# Patient Record
Sex: Female | Born: 1975 | Race: White | Hispanic: No | Marital: Single | Smoking: Never smoker
Health system: Southern US, Community
[De-identification: ages and names within clinical notes are randomized; demographics above are authoritative.]

## PROBLEM LIST (undated history)

## (undated) DIAGNOSIS — E119 Type 2 diabetes mellitus without complications: Secondary | ICD-10-CM

## (undated) DIAGNOSIS — K589 Irritable bowel syndrome without diarrhea: Secondary | ICD-10-CM

## (undated) DIAGNOSIS — L309 Dermatitis, unspecified: Secondary | ICD-10-CM

## (undated) HISTORY — DX: Irritable bowel syndrome, unspecified: K58.9

## (undated) HISTORY — PX: COLONOSCOPY: SHX174

## (undated) HISTORY — DX: Type 2 diabetes mellitus without complications: E11.9

## (undated) HISTORY — PX: GALLBLADDER SURGERY: SHX652

## (undated) HISTORY — DX: Dermatitis, unspecified: L30.9

## (undated) HISTORY — PX: TONSILECTOMY/ADENOIDECTOMY WITH MYRINGOTOMY: SHX6125

---

## 2016-10-01 DIAGNOSIS — K219 Gastro-esophageal reflux disease without esophagitis: Secondary | ICD-10-CM | POA: Insufficient documentation

## 2018-01-08 ENCOUNTER — Other Ambulatory Visit: Payer: Self-pay | Admitting: Surgery

## 2018-01-08 DIAGNOSIS — R110 Nausea: Secondary | ICD-10-CM

## 2018-01-08 DIAGNOSIS — K2100 Gastro-esophageal reflux disease with esophagitis, without bleeding: Secondary | ICD-10-CM

## 2018-01-08 DIAGNOSIS — K449 Diaphragmatic hernia without obstruction or gangrene: Secondary | ICD-10-CM

## 2018-01-13 ENCOUNTER — Ambulatory Visit
Admission: RE | Admit: 2018-01-13 | Discharge: 2018-01-13 | Disposition: A | Discharge status: Home / Self Care | Payer: BLUE CROSS/BLUE SHIELD | Source: Ambulatory Visit | Attending: Surgery | Admitting: Surgery

## 2018-01-13 DIAGNOSIS — K2100 Gastro-esophageal reflux disease with esophagitis, without bleeding: Secondary | ICD-10-CM

## 2018-01-13 DIAGNOSIS — K449 Diaphragmatic hernia without obstruction or gangrene: Secondary | ICD-10-CM

## 2018-01-13 DIAGNOSIS — R110 Nausea: Secondary | ICD-10-CM

## 2018-01-13 MED ORDER — IOPAMIDOL (ISOVUE-300) INJECTION 61%
100.0000 mL | Freq: Once | INTRAVENOUS | Status: AC | PRN
  Administered 2018-01-13: 100 mL via INTRAVENOUS

## 2018-11-28 ENCOUNTER — Ambulatory Visit: Payer: Self-pay | Admitting: *Deleted

## 2018-11-28 NOTE — Telephone Encounter (Signed)
Pt called with having a cough. No fever, shortness of breath. She thinks it is just a cold. Has not traveled. Home care advice given to her with verbal understanding. Pt advised to call back for increase in symptoms of shortness of breath, fever, cough, chest pain or not feeling well.  Reason for Disposition . Cough  Answer Assessment - Initial Assessment Questions 1. ONSET: "When did the cough begin?"      Last couple of days 2. SEVERITY: "How bad is the cough today?"      Not really bad 3. 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 FEVER: "Do you have a fever?" If so, ask: "What is your temperature, how was it measured, and when did it start?"     no 5. SPUTUM: "Describe the color of your sputum" (clear, white, yellow, green)     clear 6. HEMOPTYSIS: "Are you coughing up any blood?" If so ask: "How much?" (flecks, streaks, tablespoons, etc.)     no 7. CARDIAC HISTORY: "Do you have any history of heart disease?" (e.g., heart attack, congestive heart failure)      no 8. LUNG HISTORY: "Do you have any history of lung disease?"  (e.g., pulmonary embolus, asthma, emphysema)     no 9. PE RISK FACTORS: "Do you have a history of blood clots?" (or: recent major surgery, recent prolonged travel, bedridden)     no 10. OTHER SYMPTOMS: "Do you have any other symptoms?" (e.g., runny nose, wheezing, chest pain)       Chest pain 11. PREGNANCY: "Is there any chance you are pregnant?" "When was your last menstrual period?"       Not pregnant LMP on now 65. TRAVEL: "Have you traveled out of the country in the last month?" (e.g., travel history, exposures)       no  Protocols used: COUGH - ACUTE PRODUCTIVE-A-AH

## 2019-12-14 DIAGNOSIS — M1712 Unilateral primary osteoarthritis, left knee: Secondary | ICD-10-CM | POA: Insufficient documentation

## 2021-03-06 ENCOUNTER — Ambulatory Visit (INDEPENDENT_AMBULATORY_CARE_PROVIDER_SITE_OTHER): Payer: BC Managed Care – PPO | Admitting: Obstetrics & Gynecology

## 2021-03-06 ENCOUNTER — Encounter: Payer: Self-pay | Admitting: Obstetrics & Gynecology

## 2021-03-06 ENCOUNTER — Other Ambulatory Visit: Payer: Self-pay

## 2021-03-06 ENCOUNTER — Other Ambulatory Visit (HOSPITAL_COMMUNITY)
Admission: RE | Admit: 2021-03-06 | Discharge: 2021-03-06 | Disposition: A | Discharge status: Home / Self Care | Payer: BC Managed Care – PPO | Source: Ambulatory Visit | Attending: Obstetrics & Gynecology | Admitting: Obstetrics & Gynecology

## 2021-03-06 VITALS — BP 134/93 | HR 103 | Ht 65.0 in | Wt 182.0 lb

## 2021-03-06 DIAGNOSIS — N951 Menopausal and female climacteric states: Secondary | ICD-10-CM | POA: Diagnosis not present

## 2021-03-06 DIAGNOSIS — N898 Other specified noninflammatory disorders of vagina: Secondary | ICD-10-CM | POA: Insufficient documentation

## 2021-03-06 DIAGNOSIS — R102 Pelvic and perineal pain: Secondary | ICD-10-CM | POA: Diagnosis not present

## 2021-03-06 DIAGNOSIS — Z139 Encounter for screening, unspecified: Secondary | ICD-10-CM | POA: Diagnosis not present

## 2021-03-06 DIAGNOSIS — N912 Amenorrhea, unspecified: Secondary | ICD-10-CM

## 2021-03-06 NOTE — Progress Notes (Signed)
   Subjective:    Patient ID: Yvette Knox, female    DOB: 02/20/1976, 45 y.o.   MRN: [id number]  HPI 45 year old female presents for evaluation of pelvic pain.  Patient was sent by her gastroenterologist to make sure that there was no GYN pathology causing pain.  The working diagnosis is bad irritable bowel syndrome.  Patient believes that she is in menopause.  She was on birth control pills for a long time and did not have her period.  She stopped several years ago and never administrated again.  Patient denies polycystic ovarian syndrome.  Patient has diarrhea and constipation.  Patient does not have pain with intercourse.  Patient has no urinary complaints.   Review of Systems  Constitutional: Negative.   Respiratory: Negative.    Cardiovascular: Negative.   Gastrointestinal:  Positive for abdominal pain, constipation and diarrhea.  Genitourinary:  Positive for pelvic pain. Negative for dyspareunia, frequency, vaginal bleeding and vaginal discharge.  Psychiatric/Behavioral: Negative.        Objective:   Physical Exam Vitals reviewed.  Constitutional:      General: She is not in acute distress.    Appearance: She is well-developed.  HENT:     Head: Normocephalic and atraumatic.  Eyes:     Conjunctiva/sclera: Conjunctivae normal.  Cardiovascular:     Rate and Rhythm: Normal rate.  Pulmonary:     Effort: Pulmonary effort is normal.  Abdominal:     General: Abdomen is flat. There is no distension.     Tenderness: There is no abdominal tenderness.  Genitourinary:    Comments: Tanner V Vulva:  No lesion Vagina:  Atrophic, no discharge Cervix:  No CMT Uterus:  Non tender, mobile Right adnexa--non tender, no mass Left adnexa--non tender, no mass   Skin:    General: Skin is warm and dry.  Neurological:     Mental Status: She is alert and oriented to person, place, and time.  Psychiatric:        Mood and Affect: Mood normal.  Vitals:   03/06/21 1334  BP: (!) 134/93   Pulse: (!) 103  Weight: 182 lb (82.6 kg)  Height: 5\' 5"  (1.651 m)       Assessment & Plan:  45 year old female presents with pelvic pain and early menopause. Will order complete pelvic ultrasound to look at her uterus and ovaries to make sure that everything is okay and not contributing to her pelvic pain. Patient needs an annual exam with Pap smear and yearly mammogram SH today to see if she is truly menopausal or has amenorrhea for another reason.  30 minutes was spent with patient during the exam, review of records, counseling, and documentation.

## 2021-03-06 NOTE — Progress Notes (Signed)
Vaginal discharge and pelvic pain

## 2021-03-07 LAB — FOLLICLE STIMULATING HORMONE: FSH: 75.1 m[IU]/mL

## 2021-03-07 LAB — CERVICOVAGINAL ANCILLARY ONLY
Bacterial Vaginitis (gardnerella): NEGATIVE
Candida Glabrata: NEGATIVE
Candida Vaginitis: NEGATIVE
Comment: NEGATIVE
Comment: NEGATIVE
Comment: NEGATIVE

## 2021-03-08 ENCOUNTER — Telehealth: Payer: Self-pay | Admitting: *Deleted

## 2021-03-08 NOTE — Telephone Encounter (Signed)
LM on voicemail regarding her recent test results and Dr Leggett's recommendations.  Encouraged patient to sign up for My chart so that she can view her test results and she can communicate messages through the my chart.

## 2021-03-08 NOTE — Telephone Encounter (Signed)
-----   Message from Yvette Dukes, MD sent at 03/08/2021 12:19 PM EDT ----- Please call patient and have her sign up for MyChart.  Also tell her that her FSH level shows that she is in menopause.  It is important that she let us know if she ever has vaginal bleeding again.  She should also take 1200 mg of calcium a day as well as 400 units of vitamin D.

## 2021-03-22 ENCOUNTER — Other Ambulatory Visit: Payer: BC Managed Care – PPO

## 2021-03-22 ENCOUNTER — Ambulatory Visit: Payer: BC Managed Care – PPO

## 2021-03-23 ENCOUNTER — Ambulatory Visit (INDEPENDENT_AMBULATORY_CARE_PROVIDER_SITE_OTHER): Payer: BC Managed Care – PPO

## 2021-03-23 ENCOUNTER — Other Ambulatory Visit: Payer: Self-pay

## 2021-03-23 DIAGNOSIS — R102 Pelvic and perineal pain: Secondary | ICD-10-CM

## 2021-03-23 DIAGNOSIS — Z1231 Encounter for screening mammogram for malignant neoplasm of breast: Secondary | ICD-10-CM

## 2021-03-27 ENCOUNTER — Other Ambulatory Visit: Payer: BC Managed Care – PPO

## 2021-04-10 ENCOUNTER — Encounter: Payer: BC Managed Care – PPO | Admitting: Obstetrics & Gynecology

## 2021-04-10 NOTE — Progress Notes (Signed)
This encounter was created in error - please disregard.

## 2021-04-17 ENCOUNTER — Other Ambulatory Visit: Payer: BC Managed Care – PPO | Admitting: Obstetrics & Gynecology

## 2021-05-01 ENCOUNTER — Other Ambulatory Visit: Payer: Self-pay

## 2021-05-01 ENCOUNTER — Other Ambulatory Visit (HOSPITAL_COMMUNITY)
Admission: RE | Admit: 2021-05-01 | Discharge: 2021-05-01 | Disposition: A | Discharge status: Home / Self Care | Payer: BC Managed Care – PPO | Source: Ambulatory Visit | Attending: Obstetrics & Gynecology | Admitting: Obstetrics & Gynecology

## 2021-05-01 ENCOUNTER — Encounter: Payer: Self-pay | Admitting: Obstetrics & Gynecology

## 2021-05-01 ENCOUNTER — Ambulatory Visit (INDEPENDENT_AMBULATORY_CARE_PROVIDER_SITE_OTHER): Payer: BC Managed Care – PPO | Admitting: Obstetrics & Gynecology

## 2021-05-01 VITALS — BP 137/91 | HR 102 | Ht 65.0 in | Wt 179.0 lb

## 2021-05-01 DIAGNOSIS — E119 Type 2 diabetes mellitus without complications: Secondary | ICD-10-CM

## 2021-05-01 DIAGNOSIS — Z01812 Encounter for preprocedural laboratory examination: Secondary | ICD-10-CM

## 2021-05-01 DIAGNOSIS — R9389 Abnormal findings on diagnostic imaging of other specified body structures: Secondary | ICD-10-CM

## 2021-05-01 LAB — POCT URINE PREGNANCY: Preg Test, Ur: NEGATIVE

## 2021-05-01 NOTE — Progress Notes (Signed)
   Subjective:    Patient ID: Yvette Knox, female    DOB: [birth year], 45 y.o.   MRN: [id number]  HPI  45 year old female presents for Demetra biopsy.  She is postmenopausal is found to have a thickened endometrium on ultrasound.  She has not had any bleeding.  Pain is better.  Patient recently was diagnosed with COVID and also bronchitis.  She was placed on steroids.  Her blood sugar went very high up into the 600s.  She was diagnosed with type 2 diabetes.  Her hemoglobin A1c was over 9.  Patient has been on metformin and her sugars have been much better.  Review of Systems  Constitutional:  Positive for fatigue.  Respiratory:  Positive for cough.   Gastrointestinal: Negative.   Genitourinary:  Positive for pelvic pain and vaginal bleeding.  Psychiatric/Behavioral: Negative.        Objective:   Physical Exam Vitals reviewed.  Constitutional:      General: She is not in acute distress.    Appearance: She is well-developed.  HENT:     Head: Normocephalic and atraumatic.  Eyes:     Conjunctiva/sclera: Conjunctivae normal.  Cardiovascular:     Rate and Rhythm: Normal rate.  Pulmonary:     Effort: Pulmonary effort is normal.  Abdominal:     General: Abdomen is flat.     Palpations: Abdomen is soft.  Genitourinary:    Comments: Tanner V Vulva:  No lesion Vagina:  Pink, no lesions, no discharge, no blood, atrophic Cervix:  No CMT Skin:    General: Skin is warm and dry.  Neurological:     Mental Status: She is alert and oriented to person, place, and time.  Psychiatric:        Mood and Affect: Mood normal.   ENDOMETRIAL BIOPSY     The indications for endometrial biopsy were reviewed.   Risks of the biopsy including cramping, bleeding, infection, uterine perforation, inadequate specimen and need for additional procedures  were discussed. The patient states she understands and agrees to undergo procedure today. Consent was signed. Time out was performed. Urine HCG was  negative. A sterile speculum was placed in the patient's vagina and the cervix was prepped with Betadine. A single-toothed tenaculum was placed on the anterior lip of the cervix to stabilize it. The 3 mm pipelle was introduced into the endometrial cavity without difficulty to a depth of 8 cm, and a moderate amount of tissue was obtained and sent to pathology. The instruments were removed from the patient's vagina. Minimal bleeding from the cervix was noted. The patient tolerated the procedure well. Routine post-procedure instructions were given to the patient. The patient will follow up to review the results and for further management.    Assessment & Plan:  45 year old female presents for endometrial biopsy.  She also had some question about her diabetes.  Her sugar approximately 3 hours postprandial is 195.  Patient was given some suggestions on decreasing her beverages with sugar.  She will follow-up with her primary care provider for her diabetes.  Patient should continue her metformin.  Endometrial biopsy performed without incident.  Patient still needs to come in for annual exam.

## 2021-05-03 LAB — SURGICAL PATHOLOGY

## 2021-05-08 ENCOUNTER — Telehealth (INDEPENDENT_AMBULATORY_CARE_PROVIDER_SITE_OTHER): Payer: BC Managed Care – PPO | Admitting: Obstetrics & Gynecology

## 2021-05-08 ENCOUNTER — Encounter: Payer: Self-pay | Admitting: Obstetrics & Gynecology

## 2021-05-08 ENCOUNTER — Other Ambulatory Visit: Payer: Self-pay

## 2021-05-08 DIAGNOSIS — N840 Polyp of corpus uteri: Secondary | ICD-10-CM

## 2021-05-08 DIAGNOSIS — E1169 Type 2 diabetes mellitus with other specified complication: Secondary | ICD-10-CM

## 2021-05-08 DIAGNOSIS — Z712 Person consulting for explanation of examination or test findings: Secondary | ICD-10-CM

## 2021-05-08 DIAGNOSIS — R9389 Abnormal findings on diagnostic imaging of other specified body structures: Secondary | ICD-10-CM

## 2021-05-08 DIAGNOSIS — E119 Type 2 diabetes mellitus without complications: Secondary | ICD-10-CM | POA: Insufficient documentation

## 2021-05-08 NOTE — Progress Notes (Signed)
[place] Sonohysterogram ordered per Dr.Leggett

## 2021-05-08 NOTE — Progress Notes (Signed)
GYNECOLOGY VIRTUAL VISIT ENCOUNTER NOTE  Provider location: [facility] [place]   Patient location: Car  I connected with Yvette Knox on 05/08/21 at  2:50 PM EDT by MyChart Video Encounter and verified that I am speaking with the correct person using two identifiers.   I discussed the limitations, risks, security and privacy concerns of performing an evaluation and management service virtually and the availability of in person appointments. I also discussed with the patient that there may be a patient responsible charge related to this service. The patient expressed understanding and agreed to proceed.   History:  Yvette Knox is a 45 y.o. G3P3 female being evaluated today to discuss results of the endometrial biopsy.  Patient had a ultrasound for pelvic pain and was found to have a thickened endometrium.  She is postmenopausal.  She does not have menstrual cycles and her FSH is elevated.  Details of her ultrasound and biopsy are below.  Patient is not having any bleeding or pain today.     Past Medical History:  Diagnosis Date   Eczema    IBS (irritable bowel syndrome)    Type 2 diabetes mellitus (HCC)    Past Surgical History:  Procedure Laterality Date   CESAREAN SECTION     COLONOSCOPY     GALLBLADDER SURGERY     TONSILECTOMY/ADENOIDECTOMY WITH MYRINGOTOMY     The following portions of the patient's history were reviewed and updated as appropriate: allergies, current medications, past family history, past medical history, past social history, past surgical history and problem list.    Review of Systems:  Pertinent items noted in HPI and remainder of comprehensive ROS otherwise negative.  Physical Exam:   General:  Alert, oriented and cooperative. Patient appears to be in no acute distress.  Mental Status: Normal mood and affect. Normal behavior. Normal judgment and thought content.   Respiratory: Normal respiratory effort, no problems with  respiration noted  Rest of physical exam deferred due to type of encounter  Labs and Imaging Results for orders placed or performed in visit on 05/01/21 (from the past 336 hour(s))  Surgical pathology( [facility])   Collection Time: 05/01/21  2:32 PM  Result Value Ref Range   SURGICAL PATHOLOGY      SURGICAL PATHOLOGY CASE: [id number] PATIENT: Yvette Knox Surgical Pathology Report     Clinical History: thickened endometrium (cm)     FINAL MICROSCOPIC DIAGNOSIS:  A. ENDOMETRIUM, BIOPSY: - Endometrial polyp. - Proliferative endometrium. - No hyperplasia or malignancy.   GROSS DESCRIPTION:  Received in formalin are 2 x 2 x 0.3 cm of soft tan-red tissue and bloody mucus.  The specimen is submitted in toto.  [facility] 05/02/2021)     Final Diagnosis performed by Valinda Hoar, MD.   Electronically signed 05/03/2021 Technical component performed at [facility]. [facility], [address], [place], [place] [address].  Professional component performed at [facility], [address] [place], [place] [address].  Immunohistochemistry Technical component (if applicable) was performed at [facility] [address], [place], [place] [address].   IMMUNOHISTOCHEMISTRY DISCLAIMER (if applicable): Some of thes e immunohistochemical stains may have been developed and the performance characteristics determine by [facility]. Some may not have been cleared or approved by the U.S. Food and Drug Administration. The FDA has determined that such clearance or approval is not necessary. This test is used for clinical purposes. It should not be regarded as  investigational or for research. This laboratory is certified under the Clinical Laboratory Improvement Amendments of 1988 (CLIA-88) as qualified to perform high complexity clinical laboratory testing.  The controls stained appropriately.   POCT urine  pregnancy   Collection Time: 05/01/21  2:36 PM  Result Value Ref Range   Preg Test, Ur Negative Negative   FINDINGS: Uterus   Measurements: 7.4 x 3.2 x 4.4 cm = volume: 55.0 mL. Uterus is anteverted. Heterogeneous echotexture seen within the uterine myometrium without discrete fibroid or other mass. C-section scar noted.   Endometrium   Endometrial stripe appears somewhat thickened up to 9.6 mm (image 63). No focal abnormality or abnormal vascularity.   Right ovary   Measurements: 2.2 x 0.8 x 2.0 cm = volume: 2.0 mL. Normal appearance/no adnexal mass.   Left ovary   Not visualized.  No adnexal mass.   Other findings   No abnormal free fluid.   IMPRESSION: 1. No acute abnormality within the pelvis. 2. Thickening of the endometrial stripe up to 9.6 mm, considered abnormal for an asymptomatic post-menopausal female. Endometrial sampling should be considered to exclude carcinoma. 3. Normal sonographic appearance of the uterus. Sequelae of prior C-section. 4. Normal right ovary, with nonvisualization of the left ovary. No adnexal mass or free fluid.   Assessment and Plan:     Postmenopausal female with thickened endometrium and suspected polyp on biopsy.  We will proceed with a saline sonogram as the next step to determine if there is endometrial polyp.  Patient is menopausal so will not have to balance with menstrual cycle.      I discussed the assessment and treatment plan with the patient. The patient was provided an opportunity to ask questions and all were answered. The patient agreed with the plan and demonstrated an understanding of the instructions.   The patient was advised to call back or seek an in-person evaluation/go to the ED if the symptoms worsen or if the condition fails to improve as anticipated.  22 minutes of virtual visit time, counseling, review of records, documentation and coordination of care was used for this visit.   Elsie Lincoln,  MD [facility] for [employer]

## 2021-06-01 ENCOUNTER — Ambulatory Visit
Admission: RE | Admit: 2021-06-01 | Discharge: 2021-06-01 | Disposition: A | Discharge status: Home / Self Care | Payer: BC Managed Care – PPO | Source: Ambulatory Visit | Attending: Obstetrics & Gynecology | Admitting: Obstetrics & Gynecology

## 2021-06-01 DIAGNOSIS — N840 Polyp of corpus uteri: Secondary | ICD-10-CM

## 2021-06-05 ENCOUNTER — Ambulatory Visit: Payer: BC Managed Care – PPO | Admitting: Family Medicine

## 2021-06-26 ENCOUNTER — Ambulatory Visit: Payer: BC Managed Care – PPO | Admitting: Obstetrics & Gynecology

## 2021-06-29 ENCOUNTER — Telehealth: Payer: Self-pay

## 2021-06-29 NOTE — Telephone Encounter (Addendum)
Attemped to call pt to follow up from Dr.Leggett's MyChart message. VM left letting pt know that Dr.Leggett sent her a MyChart message requesting menstrual calendar and to see if she has had any bleeding since procedure. I asked that pt sign into MyChart and respond to Dr.Leggett's message since it is the quickest way to communicate with her.  ----- Message from Lesly Dukes, MD sent at 06/23/2021  9:55 AM EDT ----- I asked Leonette to send me a menstrual calendar.  Can you follow up with her midweek if it doesn't come in.  Thanks!

## 2022-03-26 IMAGING — MG MM DIGITAL SCREENING BILAT W/ TOMO AND CAD
8 series · 8 of 24 positions shown · non-contrast
Comparison: Previous exam(s).

CLINICAL DATA: Screening.

EXAM:
DIGITAL SCREENING BILATERAL MAMMOGRAM WITH TOMOSYNTHESIS AND CAD
TECHNIQUE: Bilateral screening digital craniocaudal and mediolateral oblique
mammograms were obtained. Bilateral screening digital breast
tomosynthesis was performed. The images were evaluated with
computer-aided detection.

[L CC synth-2D]
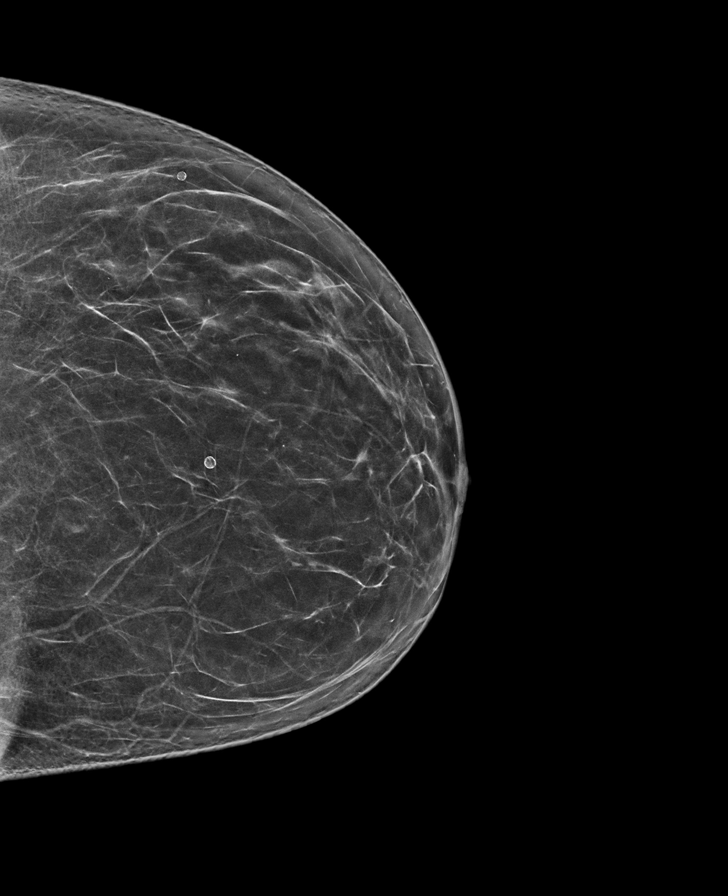

[R MLO synth-2D]
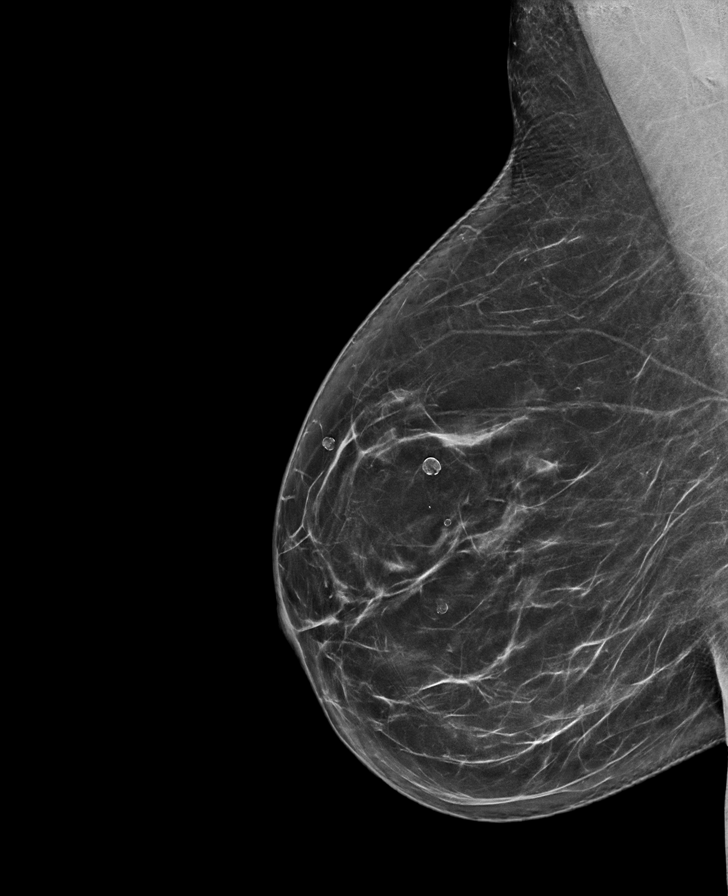

[L MLO synth-2D]
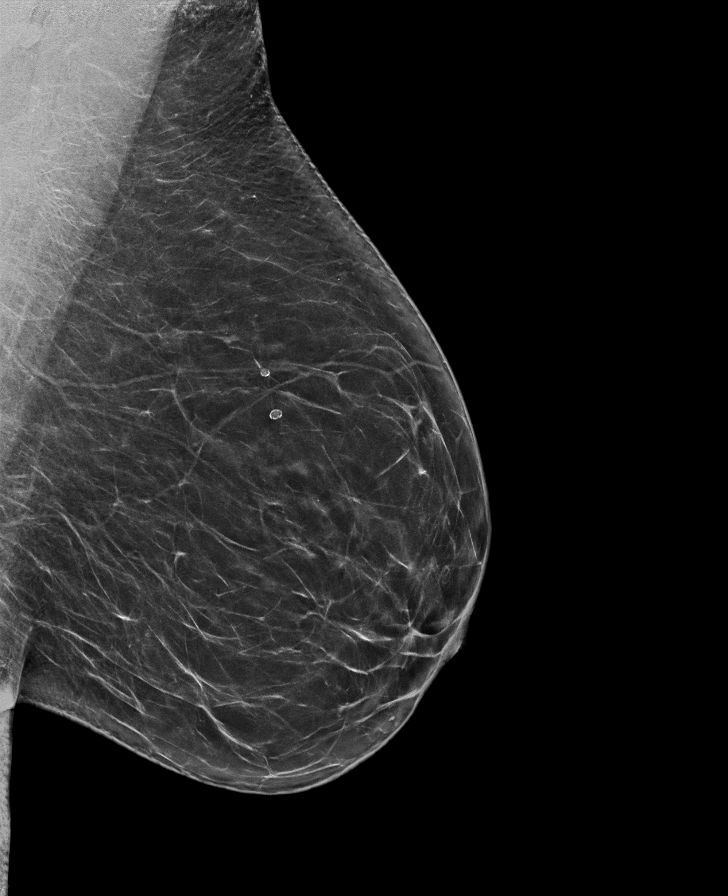

[R CC synth-2D]
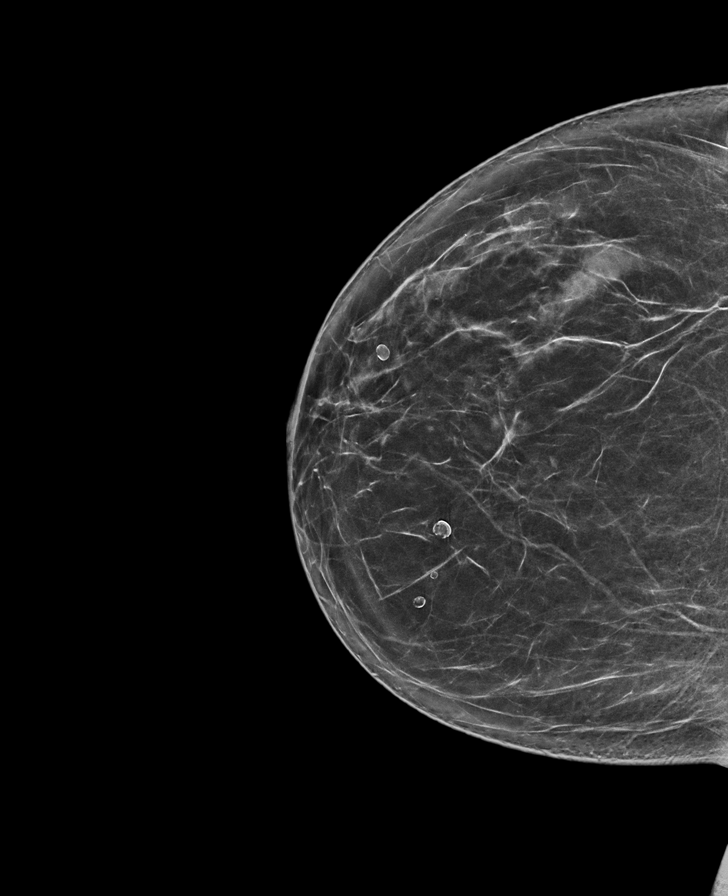

[L MLO tomo · tomo slice 39/78.0]
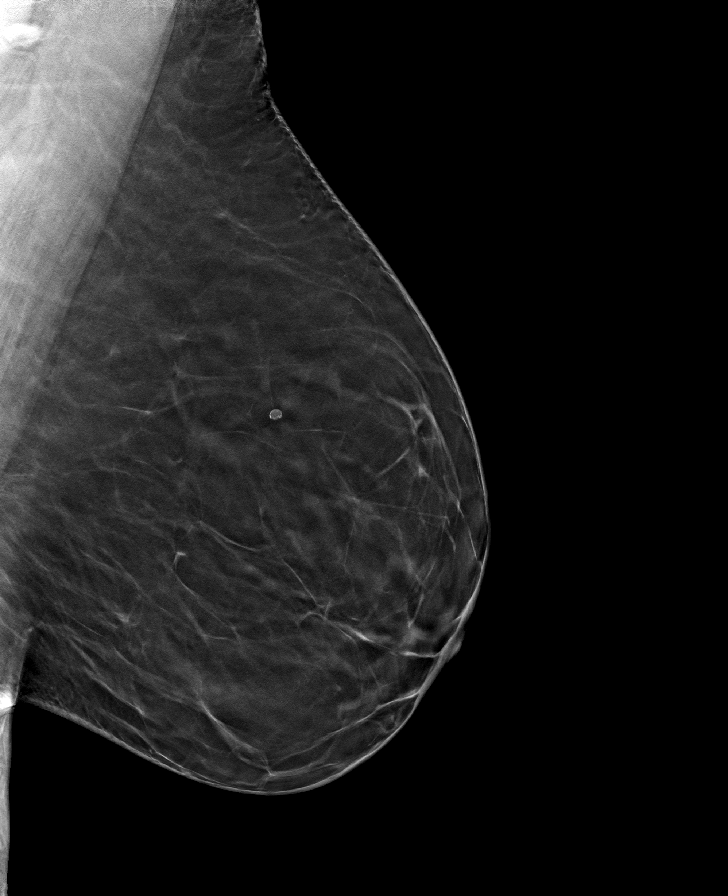

[L CC tomo · tomo slice 35/69.0]
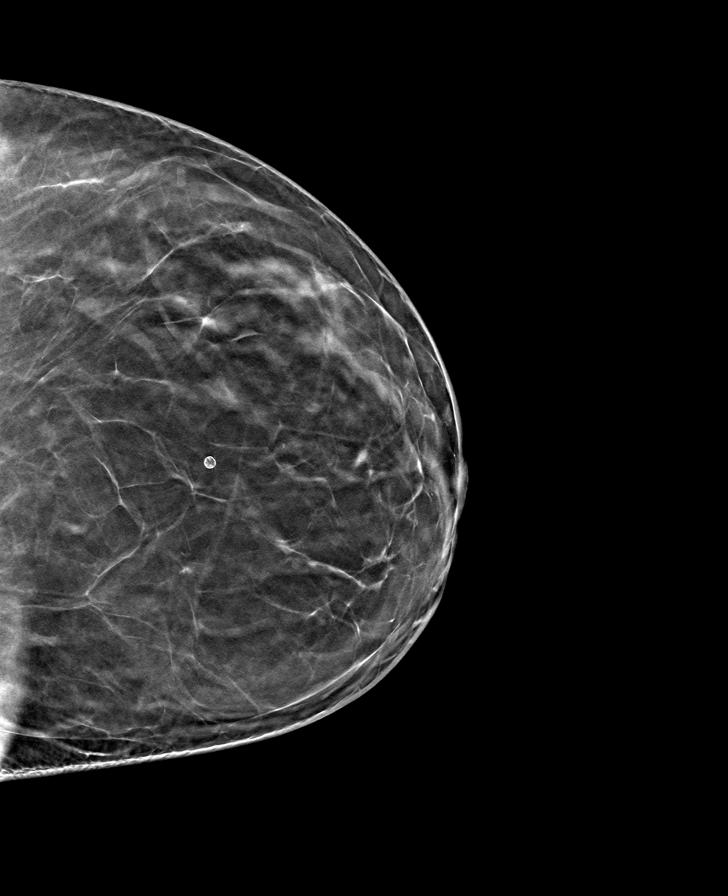

[R CC tomo · tomo slice 35/70.0]
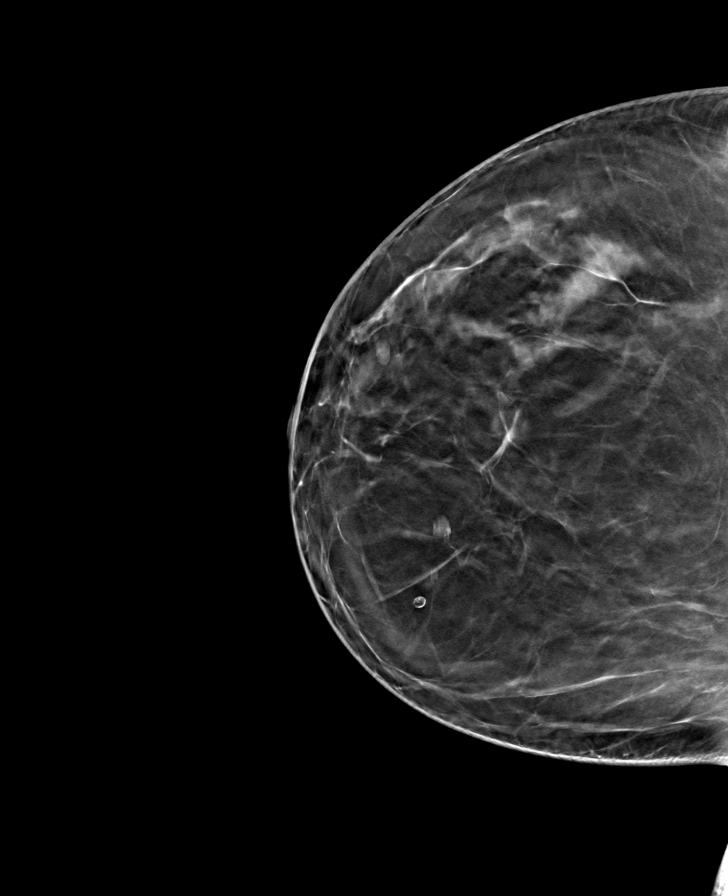

[R MLO tomo · tomo slice 39/78.0]
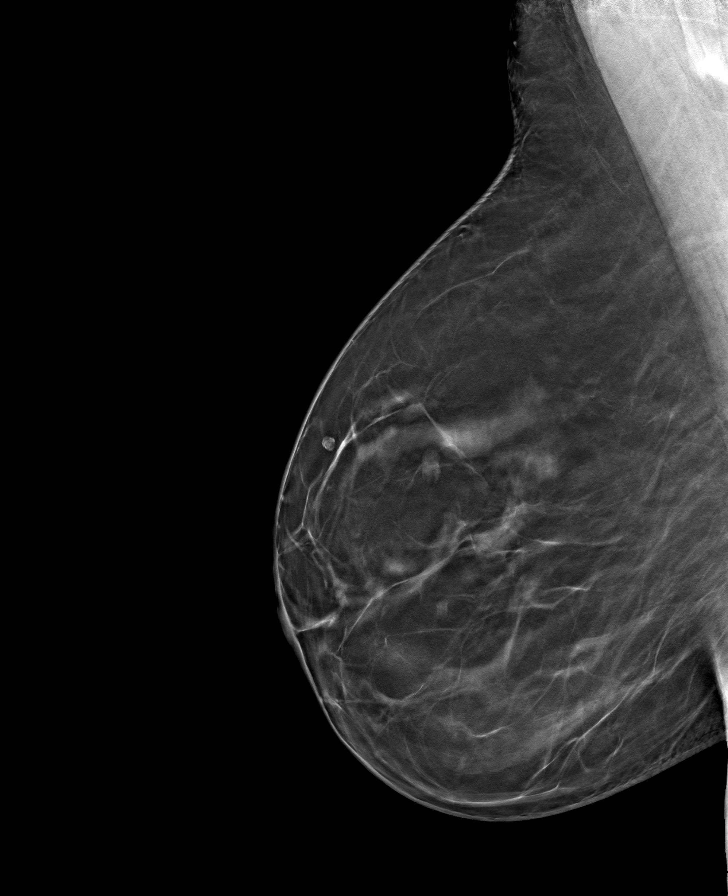

[8 of 24 positions shown; findings below may reference images not displayed]

ACR Breast Density Category b: There are scattered areas of
fibroglandular density.
FINDINGS: There are no findings suspicious for malignancy.
IMPRESSION: No mammographic evidence of malignancy. A result letter of this
screening mammogram will be mailed directly to the patient.

RECOMMENDATION:
Screening mammogram in one year. (Code:51-O-LD2)

BI-RADS CATEGORY  1: Negative.

## 2022-03-26 IMAGING — US US PELVIS COMPLETE WITH TRANSVAGINAL
1 series · 13 of 25 positions shown · non-contrast
Comparison: Prior CT from 01/13/2018.

CLINICAL DATA: Initial evaluation for pelvic pain.



[Series 1: us pelvic complete with transvaginal · 64 acquisitions, 13 frames shown]
[im 1/64]
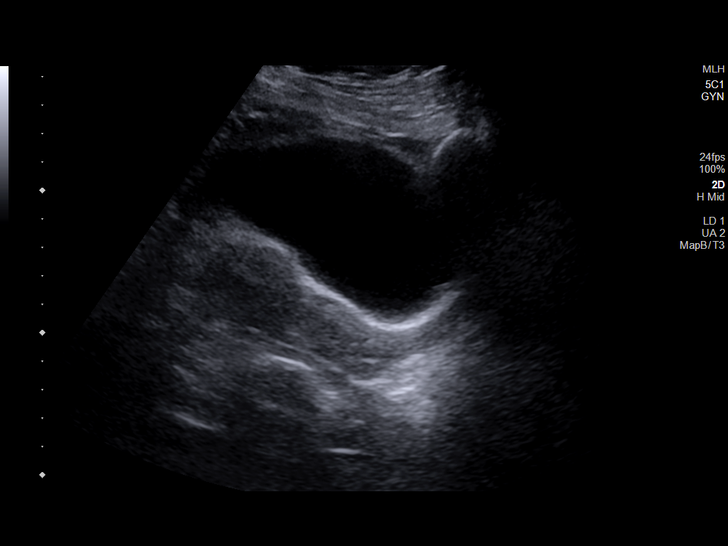
[im 6/64]
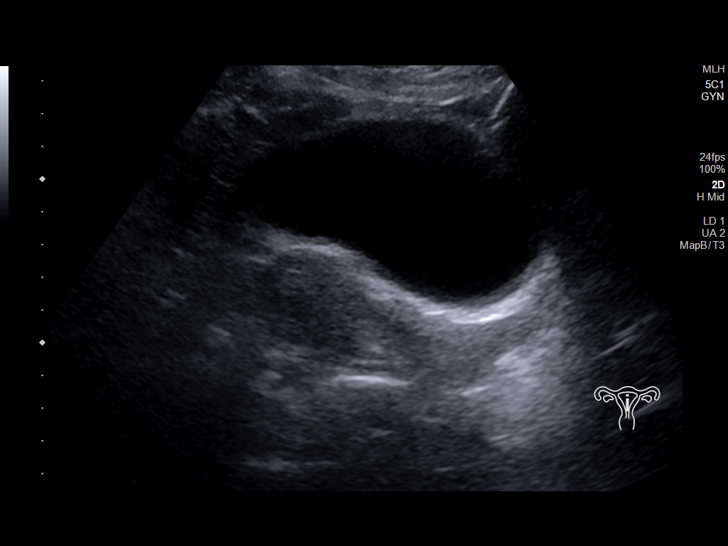
[im 11/64]
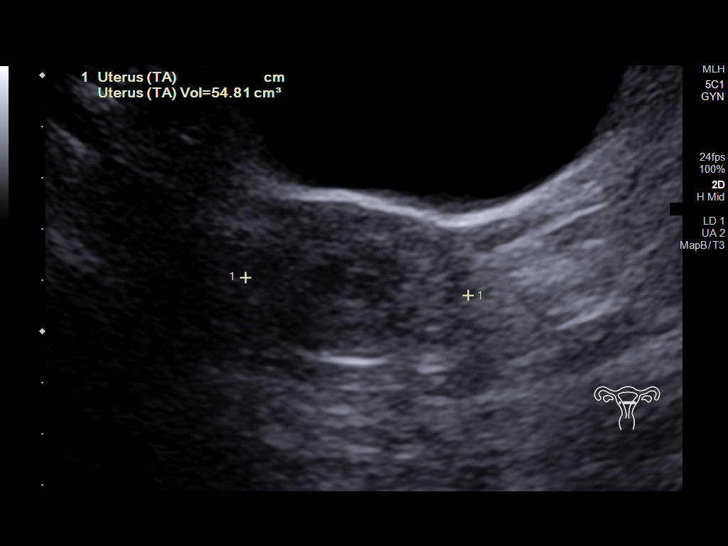
[im 16/64]
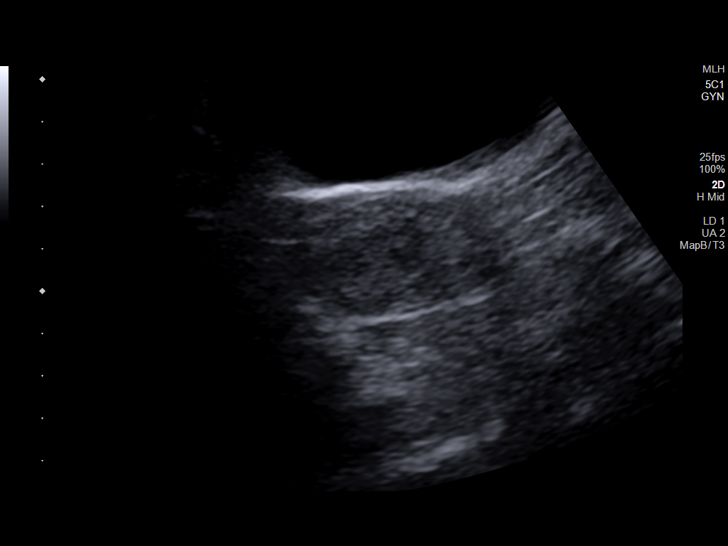
[im 22/64]
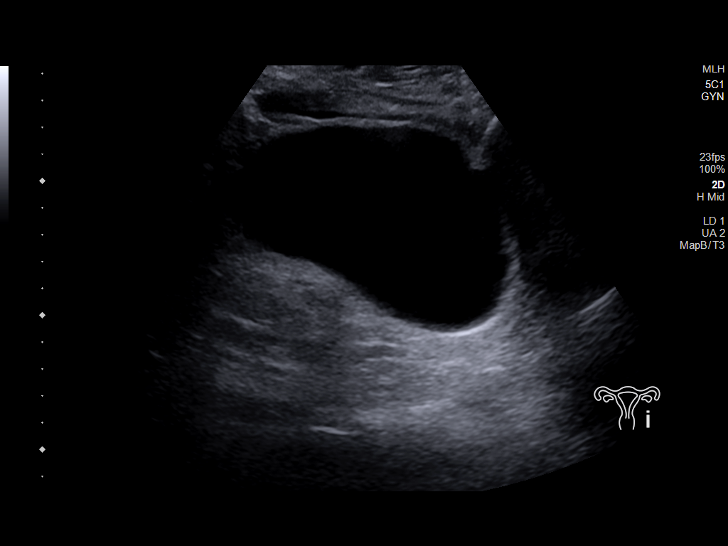
[im 27/64]
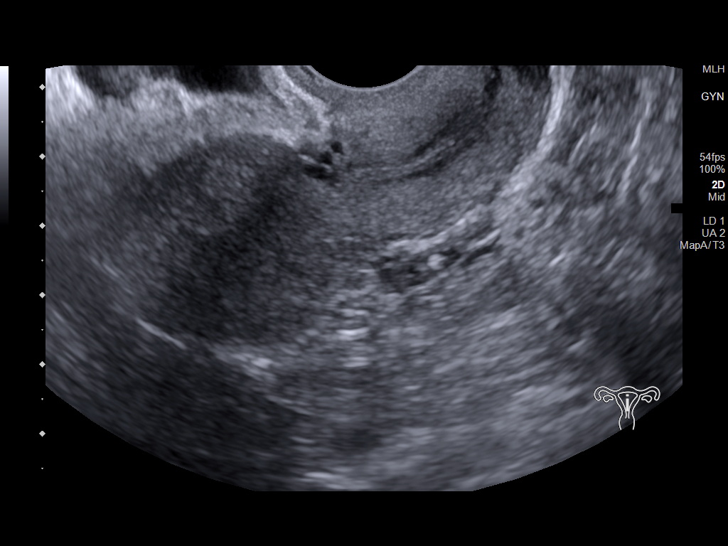
[im 32/64]
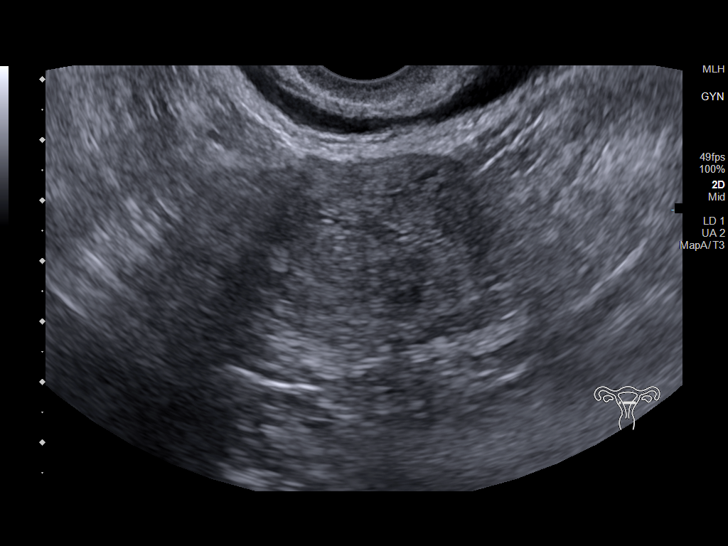
[im 37/64]
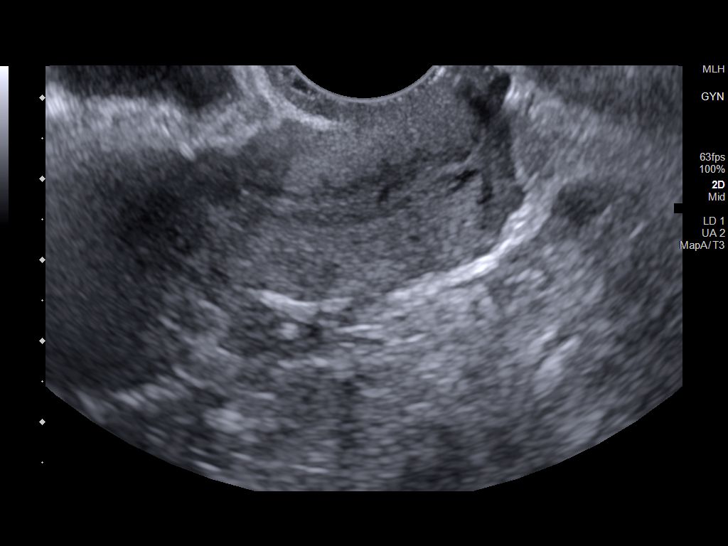
[im 43/64]
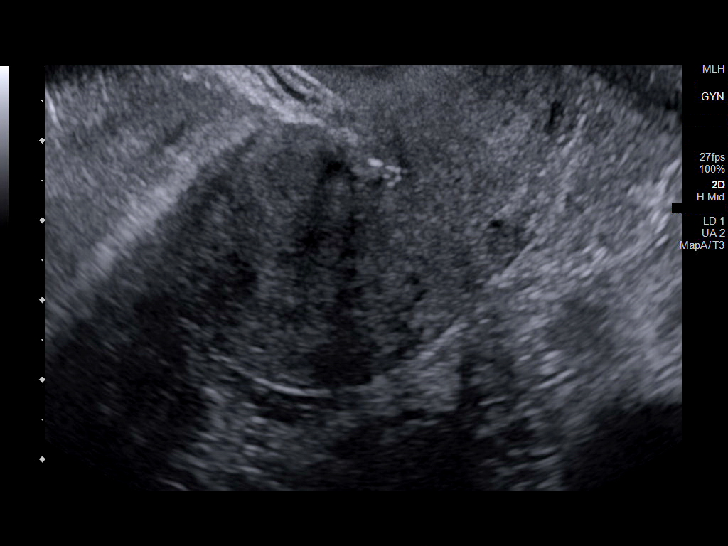
[im 48/64]
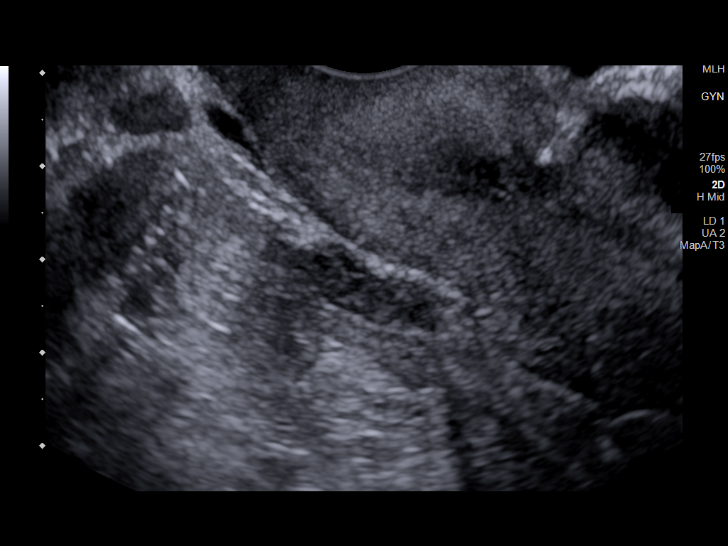
[im 53/64]
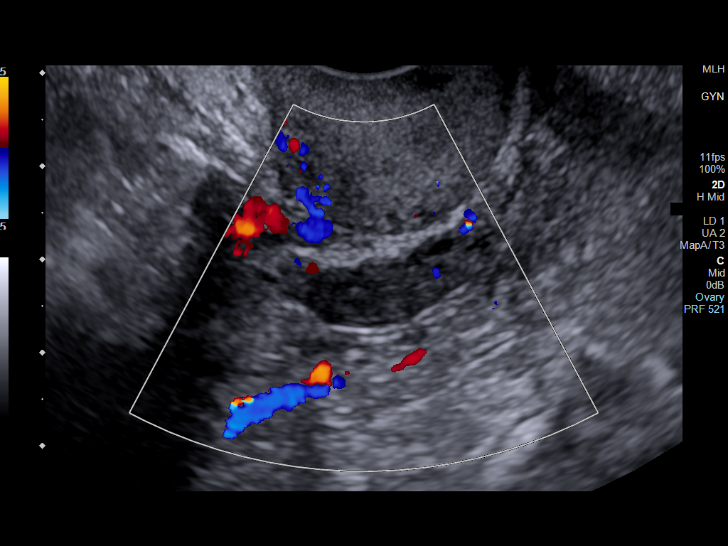
[im 58/64]
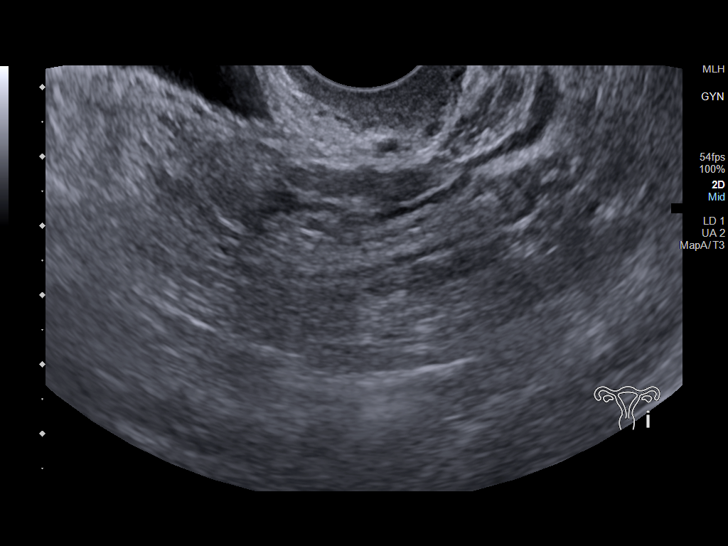
[im 64/64]
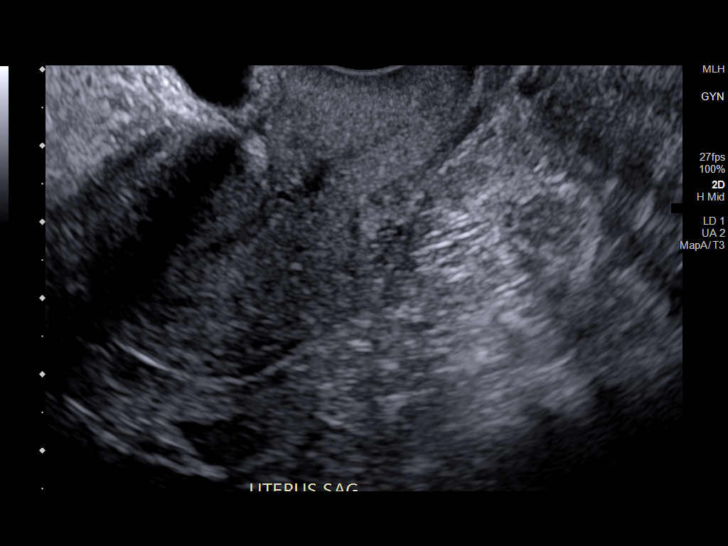

[13 of 25 positions shown; findings below may reference images not displayed]

FINDINGS: Uterus

Measurements: 7.4 x 3.2 x 4.4 cm = volume: 55.0 mL. Uterus is
anteverted. Heterogeneous echotexture seen within the uterine
myometrium without discrete fibroid or other mass. C-section scar
noted.

Endometrium

Endometrial stripe appears somewhat thickened up to 9.6 mm (image
63). No focal abnormality or abnormal vascularity.

Right ovary

Measurements: 2.2 x 0.8 x 2.0 cm = volume: 2.0 mL. Normal
appearance/no adnexal mass.

Left ovary

Not visualized.  No adnexal mass.

Other findings

No abnormal free fluid.
IMPRESSION: 1. No acute abnormality within the pelvis.
2. Thickening of the endometrial stripe up to 9.6 mm, considered
abnormal for an asymptomatic post-menopausal female. Endometrial
sampling should be considered to exclude carcinoma.
3. Normal sonographic appearance of the uterus. Sequelae of prior
C-section.
4. Normal right ovary, with nonvisualization of the left ovary. No
adnexal mass or free fluid.

## 2022-06-04 IMAGING — US US SONOHYSTEROGRAM - WITH RAD
2 series · 13 of 25 positions shown · non-contrast
Comparison: 03/23/2021 pelvic sonogram.

CLINICAL DATA: 45-year-old peri-menopausal female with endometrial
thickening on recent sonogram performed for pelvic pain. Patient
reports LMP "years ago". Endometrial biopsy 05/01/2021 demonstrated
endometrial polyp with no hyperplasia or malignancy.

EXAM:
US SONOHYSTEROGRAM
TECHNIQUE: Following cleansing of the cervix and vagina with Betadine, a
hysterosalpingogram catheter was placed within the endocervical
canal. Sonohysterogram was then performed with transvaginal
sonography during infusion of sterile saline solution into the
endometrial cavity.

[Series 1: us sonohysterogram - with rad · 0.10mm/px · 15 acquisitions, 8 frames shown (1 of 2)]
[im 1/15]
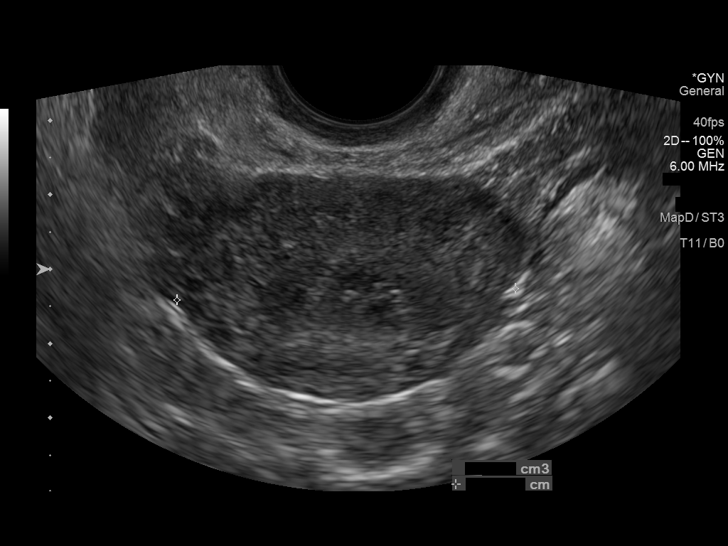
[im 3/15]
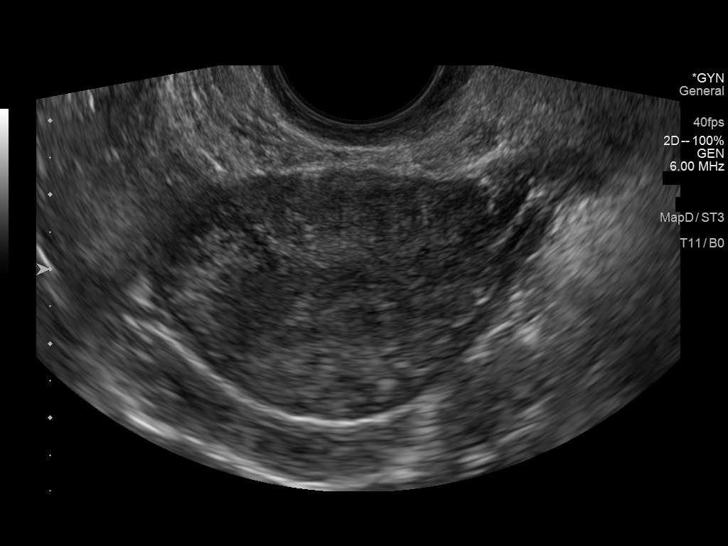
[im 5/15]
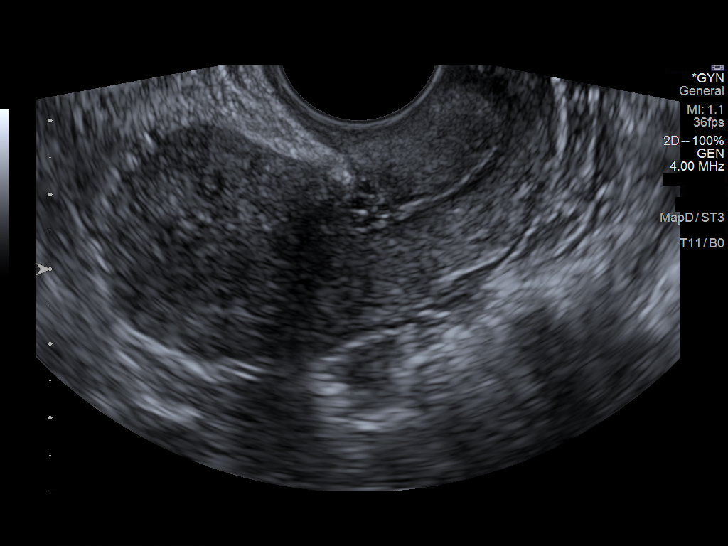
[im 7/15]
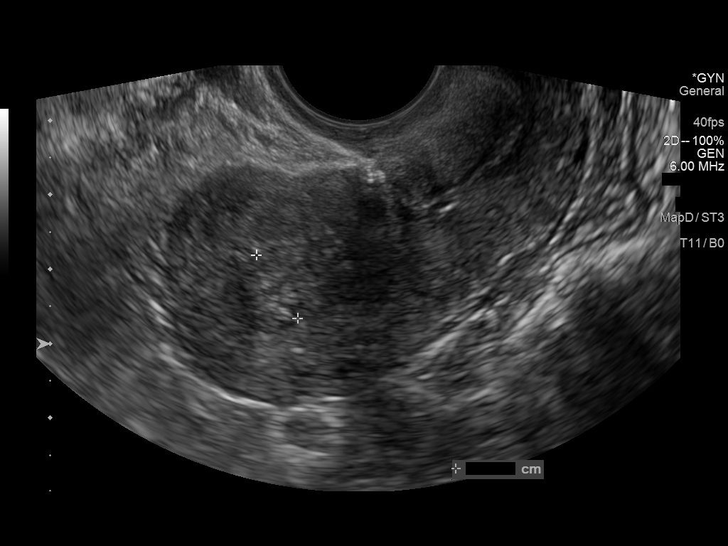
[im 9/15]
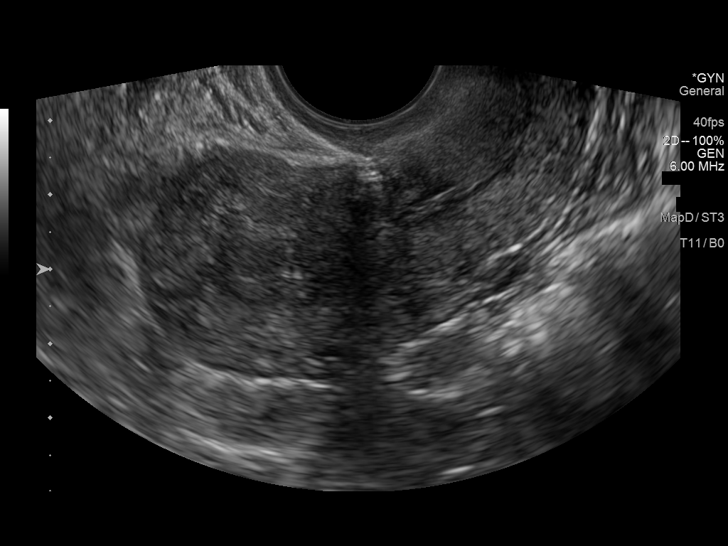
[im 11/15]
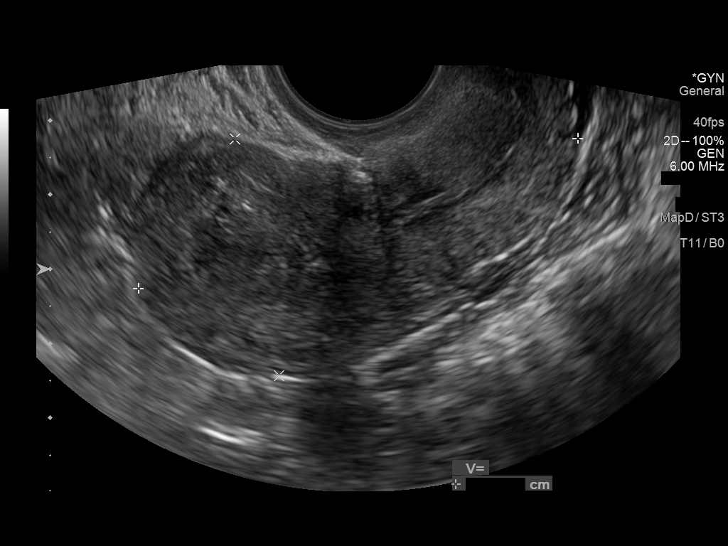
[im 13/15]
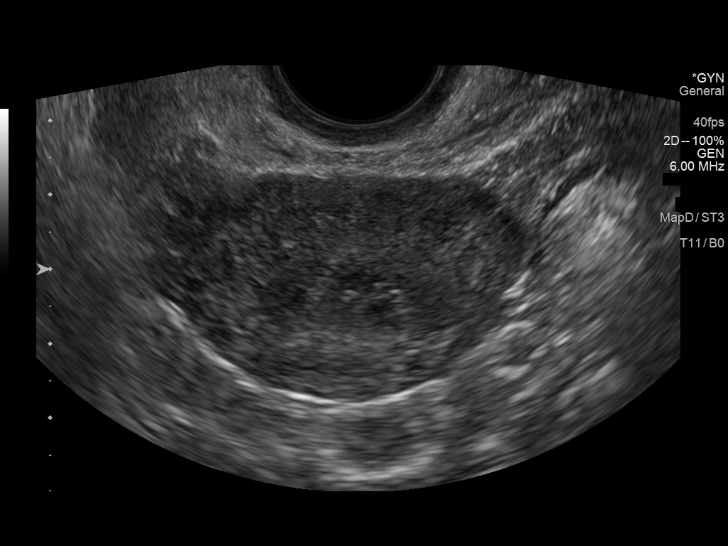
[im 15/15]
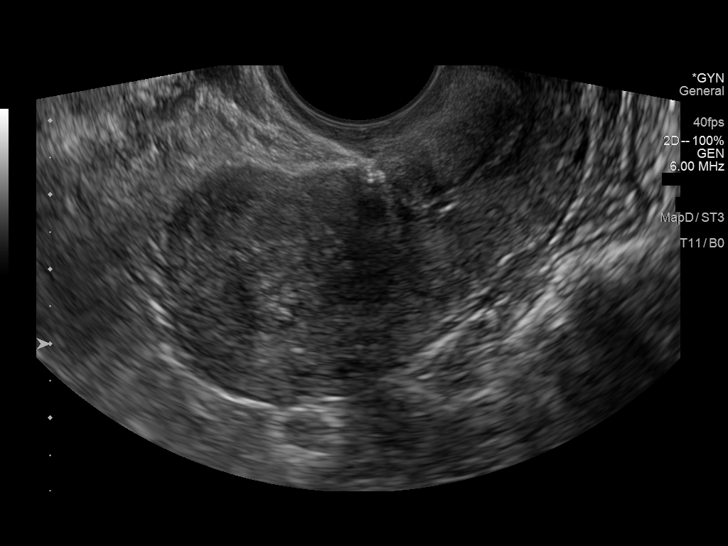

[Series 2: us sonohysterogram - with rad · 0.11mm/px · 5 of 10 slices shown (2 of 2)]
[im 2/10]
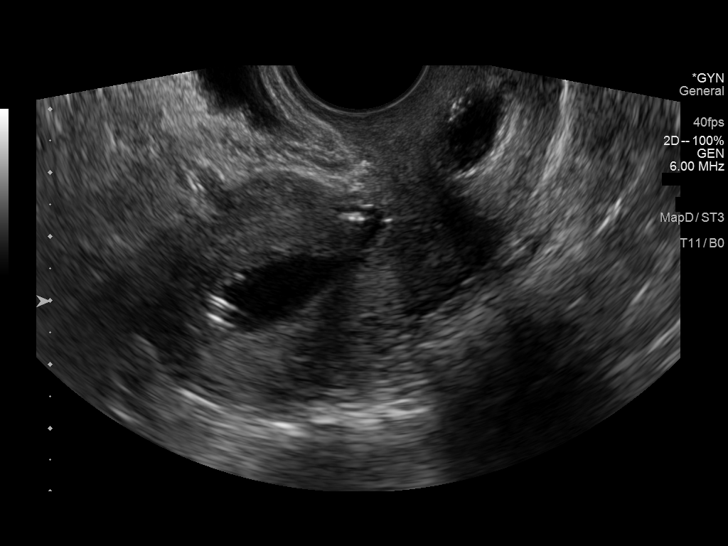
[im 4/10]
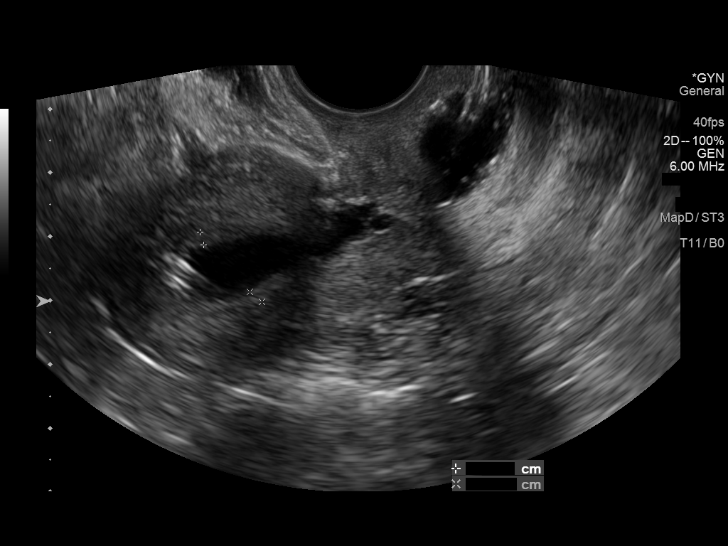
[im 6/10]
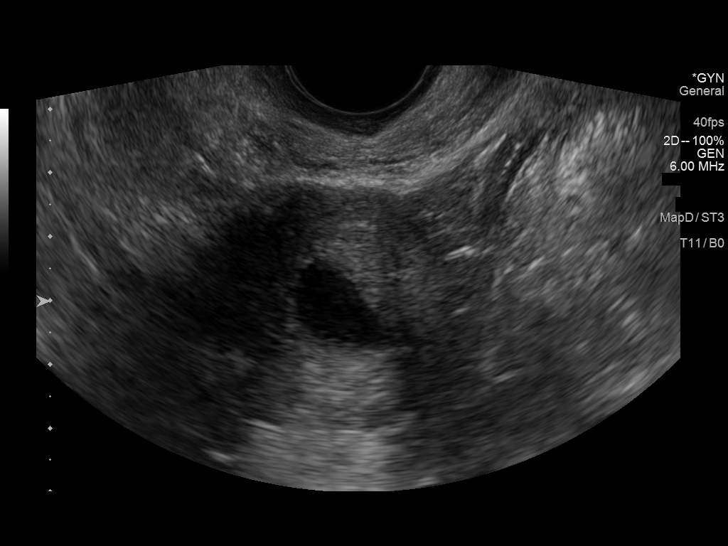
[im 8/10]
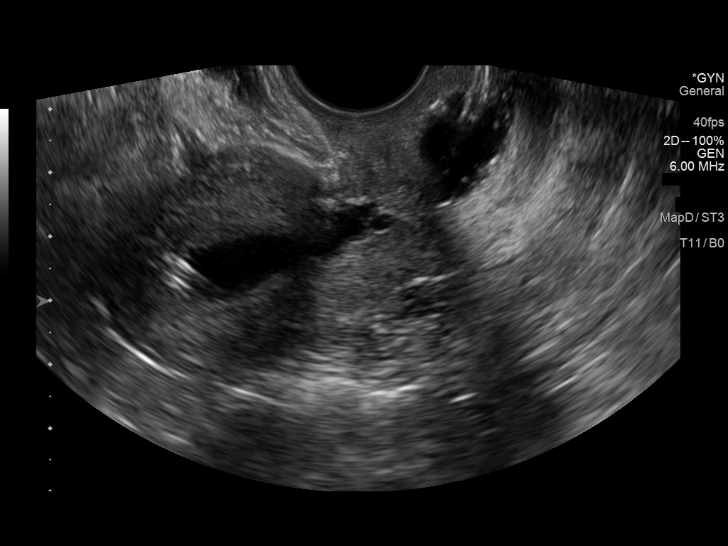
[im 10/10]
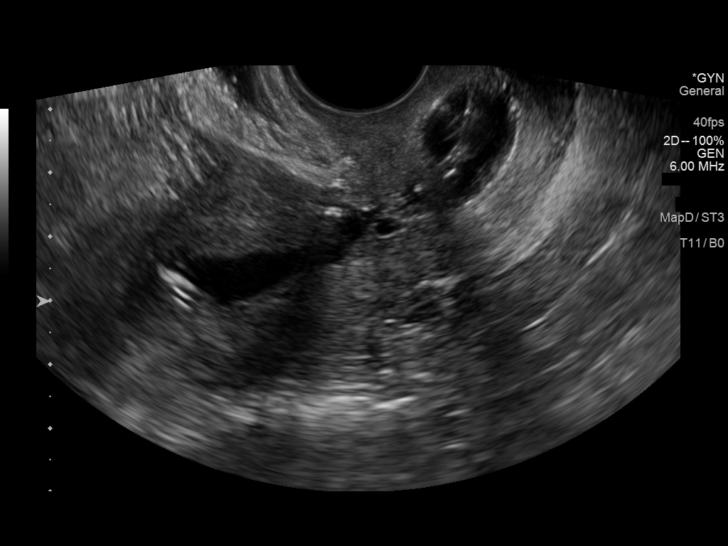

[13 of 25 positions shown; findings below may reference images not displayed]

FINDINGS: There is acoustic shadowing in the lower uterine segment due to an
apparent cesarean scar, limiting visualization in this location. No
polyps, masses or focal areas of thickening are seen involving the
endometrium. No submucosal fibroids or other abnormality of the
endometrial cavity identified. Endometrial myometrial interface is
somewhat indistinct. Estimated bilayer endometrial thickness 7 mm at
sonohysterography.
IMPRESSION: No evidence of endometrial polyp or other focal abnormality at
sonohysterography. Estimated bilayer endometrial thickness 7 mm. No
uterine fibroids. Cesarean scar in the anterior lower uterine
segment.
